# Patient Record
Sex: Female | Born: 1960 | Race: White | Hispanic: No | Marital: Single | Smoking: Never smoker
Health system: Southern US, Community
[De-identification: ages and names within clinical notes are randomized; demographics above are authoritative.]

## PROBLEM LIST (undated history)

## (undated) DIAGNOSIS — H9190 Unspecified hearing loss, unspecified ear: Secondary | ICD-10-CM

## (undated) DIAGNOSIS — F419 Anxiety disorder, unspecified: Secondary | ICD-10-CM

## (undated) DIAGNOSIS — Q249 Congenital malformation of heart, unspecified: Secondary | ICD-10-CM

## (undated) HISTORY — PX: CARDIAC SURGERY: SHX584

---

## 2013-07-24 ENCOUNTER — Emergency Department
Admission: EM | Admit: 2013-07-24 | Disposition: A | Discharge status: Home / Self Care | Payer: Self-pay | Source: Ambulatory Visit | Attending: Emergency Medicine | Admitting: Emergency Medicine

## 2013-07-24 LAB — HM HIV SCREENING OFFERED

## 2013-07-24 NOTE — First Provider Contact (Signed)
ED Medical Screening Exam Note    Initial provider evaluation performed by   ED First Provider Contact    Date/Time Event User Comments    07/24/13 2331 ED Provider First Contact Husam Hohn A Initial Face to Face Provider Contact            Larey SeatFell tonight and c/o mainly right knee pain   Some low back pain and right arm pain also but seems minimal     Orders placed:  XRAYS     Patient requires further evaluation.     Tramaine Snell, PA, 07/24/2013, 11:31 PM

## 2013-07-24 NOTE — ED Notes (Addendum)
Right leg injury after falling in parking lot.  PT right leg is swollen.  ASL interpreter paged.

## 2013-07-25 ENCOUNTER — Encounter: Payer: Self-pay | Admitting: Emergency Medicine

## 2013-07-25 MED ORDER — IBUPROFEN 400 MG PO TABS *I*
800.0000 mg | ORAL_TABLET | Freq: Once | ORAL | Status: DC
Start: 2013-07-25 — End: 2013-07-25

## 2013-07-25 NOTE — ED Notes (Signed)
Patient verbalizes understanding of discharge instructions and pain mgmt. Appropriate clothing in place, VSS, AOx4. Patient ambulated out on own with crutches, stable while ambulatory, using crutches appropriately

## 2013-07-25 NOTE — ED Provider Progress Notes (Signed)
ED Provider Progress Note    Trace knee effusion seen on xray. Knee immobilizer and crutches given. Patient ambulates without assistance. Will d/c to home with PCP f/u. Patient agrees with plan       Janett BillowGiancarlo Arvilla Salada, MD, 07/25/2013, 1:49 AM

## 2013-07-25 NOTE — ED Provider Notes (Addendum)
History     Chief Complaint   Patient presents with    Leg Injury       HPI Comments: Cassie Murray is a 53 y.o. female who presents with right knee pain after a mechanical fall on uneven pavement. Patient reports she landed on her knee and arms with no head trauma, LOC, or other injuries. Patient was able to ambulate at the scene. Patient denies numbness, weakness, lacerations. Pain is 8/10, aching, worse with ambulation.      History provided by:  Patient      History reviewed. No pertinent past medical history.         No past surgical history on file.    No family history on file.      Social History      has no tobacco, alcohol, drug, and sexual activity history on file.    Living Situation    Questions Responses    Patient lives with     Homeless     Caregiver for other family member     External Services     Employment     Domestic Violence Risk           Problem List     There is no problem list on file for this patient.      Review of Systems   Review of Systems   Constitutional: Negative for fever and chills.   HENT: Negative for trouble swallowing.    Eyes: Negative for redness.   Respiratory: Negative for shortness of breath.    Cardiovascular: Negative for chest pain.   Gastrointestinal: Negative for nausea, vomiting, abdominal pain and diarrhea.   Endocrine: Negative for polyuria.   Musculoskeletal: Positive for joint swelling and gait problem. Negative for back pain, neck pain and neck stiffness.   Skin: Negative for rash.   Allergic/Immunologic: Negative for immunocompromised state.   Neurological: Negative for syncope, weakness, numbness and headaches.   Hematological: Negative for adenopathy.   Psychiatric/Behavioral: Negative for confusion.       Physical Exam       ED Triage Vitals   BP Heart Rate Heart Rate(via Pulse Ox) Resp Temp Temp Source SpO2 O2 Device O2 Flow Rate   07/24/13 2328 07/24/13 2328 -- 07/24/13 2328 07/24/13 2328 07/25/13 0219 07/24/13 2328 07/25/13 0142 --   131/62 mmHg  69  18 36.3 C (97.3 F) TEMPORAL 98 % None (Room air)       Weight           --                          Physical Exam   Constitutional: No distress.   HENT:   Head: Normocephalic and atraumatic.   Right Ear: External ear normal.   Left Ear: External ear normal.   Nose: Nose normal.   Mouth/Throat: Oropharynx is clear and moist.   Eyes: Pupils are equal, round, and reactive to light.   Neck: Normal range of motion. Neck supple. No tracheal deviation present.   Cardiovascular: Normal rate, regular rhythm and normal heart sounds.    Pulmonary/Chest:   Effort normal.  No chest tenderness.  Breath sounds present b/l  No crepitus or gross deformity   Abdominal: Soft. She exhibits no distension. There is no tenderness.   Musculoskeletal:   Mild right knee effusion with superficial abrasion. Tenderness to patella and medial joint line. No crepitus, ecchymosis, instability.  Mild tenderness to normal appearing right upper arm.    No midline spinal tenderness, Sensation and ROM grossly intact    No gross deformities of back and buttocks, Sensation and ROM grossly intact    No gross deformities of shoulders, elbows, wrists, hands Sensation and ROM grossly intact. Pulses present b/l. Normal cap refill.     Pelvis stable, nontender, with no gross deformities. Sensation of pelvis and genitals grossly intact    No other gross deformities of hips, knees, ankles, feet, Sensation and ROM grossly intact, pulses present b/l. Normal Cap refill.     Neurological:   Patient is alert and answering questions appropriately with no gross deficits.    Skin: Skin is warm and dry.   Psychiatric: Her behavior is normal.   Nursing note and vitals reviewed.      Medical Decision Making        Initial Evaluation:  ED First Provider Contact    Date/Time Event User Comments    07/24/13 2331 ED Provider First Contact CHILELLI, TIMOTHY A Initial Face to Face Provider Contact          Patient seen by me today 07/25/2013 at 0110    Assessment:  53  y.o., female comes to the ED with right knee pain and effusion after a fall from standing. Patient is at risk for fracture, effusion and sprain and requires emergent imaging with likely discharge to home.    Differential Diagnosis includes   Traumatic effusion, fracture, sprain, strain, abrasion            Plan:    Motrin  Knee x ray  Knee immobilizer, crutches, ACE wrap  Anticipate discharge to home        Janett BillowGiancarlo Rondash, MD    Janett Billowondash, Giancarlo, MD  07/25/13 [phone]        Patient seen by me on 07/25/2013 at 1:35 AM.    History:   I reviewed this patient, reviewed the resident note and agree     Exam:    I examined this patient, reviewed the resident note and agree     Decision Making:   I discussed with the documented resident decision making and agree     ED  Diagnosis:   Fall;  right knee contusion & sprain.    Author Hayes LudwigJONATHAN Raygen Dahm, MD          Hayes LudwigMarkson, Mortimer Bair, MD  07/31/13 [phone]

## 2013-07-25 NOTE — Discharge Instructions (Signed)
You have been seen and evaluated at [facility] and found to have a small knee effusion. You were treated with motrin and a knee immobilizer. You will be discharged to home. Please follow up with your PCP. Please take tylenol and ibuprofen as directed by the manufacturer for pain. Please use rest, ice, and elevation for your pain. Please see attached pamphlet for more information.    Some illnesses are best understood/treated over time.  May return day or night for significant worsening.  A call to primary care physician is recommended for any acute concerns.   Close follow up is recommended.       SEEK IMMEDIATE MEDICAL CARE IF:   You develop a rash.   You have difficulty breathing.   You have any allergic reactions from medications you may have been given.   There is severe pain with any motion of the knee.

## 2013-07-25 NOTE — ED Notes (Signed)
Patient reporting pain in R knee after falling in parking lot. Small abrasion to R knee, patient reporting 8/10 pain but does not want any pain medication. Patient was able to tolerate standing to put on pants and shoes. Provider at bedside with crutches. Will continue to monitor and treat per orders.

## 2014-05-02 ENCOUNTER — Encounter (HOSPITAL_COMMUNITY): Payer: Self-pay

## 2014-05-02 ENCOUNTER — Emergency Department (HOSPITAL_COMMUNITY): Payer: Medicare Other

## 2014-05-02 ENCOUNTER — Emergency Department (HOSPITAL_COMMUNITY)
Admission: EM | Admit: 2014-05-02 | Discharge: 2014-05-02 | Disposition: A | Discharge status: Home / Self Care | Payer: Medicare Other | Attending: Emergency Medicine | Admitting: Emergency Medicine

## 2014-05-02 DIAGNOSIS — Z8659 Personal history of other mental and behavioral disorders: Secondary | ICD-10-CM | POA: Insufficient documentation

## 2014-05-02 DIAGNOSIS — Z8774 Personal history of (corrected) congenital malformations of heart and circulatory system: Secondary | ICD-10-CM | POA: Insufficient documentation

## 2014-05-02 DIAGNOSIS — H9190 Unspecified hearing loss, unspecified ear: Secondary | ICD-10-CM | POA: Insufficient documentation

## 2014-05-02 DIAGNOSIS — G5692 Unspecified mononeuropathy of left upper limb: Secondary | ICD-10-CM | POA: Insufficient documentation

## 2014-05-02 DIAGNOSIS — R200 Anesthesia of skin: Secondary | ICD-10-CM | POA: Insufficient documentation

## 2014-05-02 DIAGNOSIS — R079 Chest pain, unspecified: Secondary | ICD-10-CM | POA: Diagnosis present

## 2014-05-02 HISTORY — DX: Congenital malformation of heart, unspecified: Q24.9

## 2014-05-02 HISTORY — DX: Anxiety disorder, unspecified: F41.9

## 2014-05-02 HISTORY — DX: Unspecified hearing loss, unspecified ear: H91.90

## 2014-05-02 LAB — BASIC METABOLIC PANEL
ANION GAP: 7 (ref 5–15)
BUN: 8 mg/dL (ref 6–23)
CALCIUM: 9.6 mg/dL (ref 8.4–10.5)
CO2: 27 mmol/L (ref 19–32)
CREATININE: 0.65 mg/dL (ref 0.50–1.10)
Chloride: 105 mmol/L (ref 96–112)
GFR calc Af Amer: >90 mL/min (ref >90)
Glucose, Bld: 90 mg/dL (ref 70–99)
Potassium: 3.9 mmol/L (ref 3.5–5.1)
Sodium: 139 mmol/L (ref 135–145)

## 2014-05-02 LAB — I-STAT TROPONIN, ED
Troponin i, poc: 0 ng/mL (ref 0.00–0.08)
Troponin i, poc: 0 ng/mL (ref 0.00–0.08)

## 2014-05-02 LAB — CBC
HCT: 42.9 % (ref 36.0–46.0)
HEMOGLOBIN: 14.5 g/dL (ref 12.0–15.0)
MCH: 30 pg (ref 26.0–34.0)
MCHC: 33.8 g/dL (ref 30.0–36.0)
MCV: 88.8 fL (ref 78.0–100.0)
Platelets: 238 10*3/uL (ref 150–400)
RBC: 4.83 MIL/uL (ref 3.87–5.11)
RDW: 13.4 % (ref 11.5–15.5)
WBC: 6.2 10*3/uL (ref 4.0–10.5)

## 2014-05-02 LAB — BRAIN NATRIURETIC PEPTIDE: B NATRIURETIC PEPTIDE 5: 17.4 pg/mL (ref 0.0–100.0)

## 2014-05-02 MED ORDER — DIAZEPAM 5 MG PO TABS: 5.0000 mg | ORAL_TABLET | Freq: Once | ORAL | Status: DC

## 2014-05-02 MED ORDER — CYCLOBENZAPRINE HCL 10 MG PO TABS: 10.0000 mg | ORAL_TABLET | Freq: Two times a day (BID) | ORAL | Status: AC | PRN

## 2014-05-02 MED ORDER — HYDROCODONE-ACETAMINOPHEN 5-325 MG PO TABS: 1.0000 | ORAL_TABLET | Freq: Four times a day (QID) | ORAL | Status: AC | PRN

## 2014-05-02 MED ORDER — LORAZEPAM 2 MG/ML IJ SOLN
1.0000 mg | Freq: Once | INTRAMUSCULAR | Status: AC
  Administered 2014-05-02: 1 mg via INTRAVENOUS
  Filled 2014-05-02: qty 1

## 2014-05-02 NOTE — ED Notes (Signed)
Pt presents with 5 day h/o L arm numbness.  Pt reports numbness to L leg that began yesterday; reports onset of palpitations and chest pain while walking today, was relieved with rest.  Pt reports shortness of breath.

## 2014-05-02 NOTE — ED Provider Notes (Signed)
CSN: [id number]     Arrival date & time 05/02/14  1450 History   First MD Initiated Contact with Patient 05/02/14 1635     No chief complaint on file.    (Consider location/radiation/quality/duration/timing/severity/associated sxs/prior Treatment) Patient is a 54 y.o. female presenting with neurologic complaint. The history is provided by the patient.  Neurologic Problem This is a new problem. The current episode started 2 days ago. The problem occurs constantly. The problem has not changed since onset.Associated symptoms include chest pain (left lateral, feels like muscle tightness). Pertinent negatives include no abdominal pain and no shortness of breath. Nothing aggravates the symptoms. Nothing relieves the symptoms.    Past Medical History  Diagnosis Date  . Congenital heart disease   . Anxiety   . Hearing impaired    Past Surgical History  Procedure Laterality Date  . Cardiac surgery     History reviewed. No pertinent family history. History  Substance Use Topics  . Smoking status: Never Smoker   . Smokeless tobacco: Not on file  . Alcohol Use: No   OB History    No data available     Review of Systems  Constitutional: Negative for fever.  Respiratory: Negative for cough and shortness of breath.   Cardiovascular: Positive for chest pain (left lateral, feels like muscle tightness). Negative for leg swelling.  Gastrointestinal: Negative for vomiting and abdominal pain.  All other systems reviewed and are negative.     Allergies  Bee venom  Home Medications   Prior to Admission medications   Not on File   BP 120/88 mmHg  Pulse 74  Temp(Src) 99 F (37.2 C) (Oral)  Resp 19  Ht 5\' 2"  (1.575 m)  Wt 145 lb (65.772 kg)  BMI 26.51 kg/m2  SpO2 100% Physical Exam  Constitutional: She is oriented to person, place, and time. She appears well-developed and well-nourished. No distress.  HENT:  Head: Normocephalic and atraumatic.  Mouth/Throat: Oropharynx is clear  and moist.  Eyes: EOM are normal. Pupils are equal, round, and reactive to light.  Neck: Normal range of motion. Neck supple.  Cardiovascular: Normal rate and regular rhythm.  Exam reveals no friction rub.   No murmur heard. Pulmonary/Chest: Effort normal and breath sounds normal. No respiratory distress. She has no wheezes. She has no rales.  Abdominal: Soft. She exhibits no distension. There is no tenderness. There is no rebound.  Musculoskeletal: Normal range of motion. She exhibits no edema.  Neurological: She is alert and oriented to person, place, and time. A sensory deficit (altered light touch and sedation the left upper arm and left forearm anterior and posteriorly.) is present. No cranial nerve deficit. She exhibits normal muscle tone. Coordination normal. GCS eye subscore is 4. GCS verbal subscore is 5. GCS motor subscore is 6.  Skin: She is not diaphoretic.  Nursing note and vitals reviewed.   ED Course  Procedures (including critical care time) Labs Review Labs Reviewed  CBC  BASIC METABOLIC PANEL  BRAIN NATRIURETIC PEPTIDE  I-STAT TROPOININ, ED    Imaging Review Dg Chest 2 View  05/02/2014   CLINICAL DATA:  Chest pain, left arm numbness  EXAM: CHEST  2 VIEW  COMPARISON:  None.  FINDINGS: Cardiomediastinal silhouette is unremarkable. No acute infiltrate or pleural effusion. No pulmonary edema. Mild linear atelectasis or scarring in lingula. Minimal degenerative changes thoracic spine.  IMPRESSION: No acute infiltrate or pulmonary edema. Subtle linear atelectasis or scarring in lingula.   Electronically Signed   By:  Natasha Mead M.D.   On: 05/02/2014 16:34   Mr Brain Wo Contrast  05/02/2014   CLINICAL DATA:  54 year old female with left upper extremity numbness for 5 days. Left lower extremity numbness began yesterday. Palpitations. Initial encounter.  EXAM: MRI HEAD WITHOUT CONTRAST  TECHNIQUE: Multiplanar, multiecho pulse sequences of the brain and surrounding structures were  obtained without intravenous contrast.  COMPARISON:  None.  FINDINGS: Cerebral volume is normal. No restricted diffusion to suggest acute infarction. No midline shift, mass effect, evidence of mass lesion, ventriculomegaly, extra-axial collection or acute intracranial hemorrhage. Cervicomedullary junction and pituitary are within normal limits. Negative visualized cervical spine. Major intracranial vascular flow voids are preserved. Wallace Cullens and white matter signal is within normal limits for age throughout the brain.  Visible internal auditory structures appear normal. Negative paranasal sinuses and mastoids except for left maxillary alveolar recess mucosal thickening. Visualized orbit soft tissues are within normal limits. Visualized scalp soft tissues are within normal limits. Bone marrow signal within normal limits.  IMPRESSION: Normal for age Normal MRI appearance of the brain.   Electronically Signed   By: Odessa Fleming M.D.   On: 05/02/2014 19:36     EKG Interpretation   Date/Time:  Thursday May 02 2014 15:06:15 EST Ventricular Rate:  82 PR Interval:  152 QRS Duration: 88 QT Interval:  364 QTC Calculation: 425 R Axis:   -32 Text Interpretation:  Normal sinus rhythm Left axis deviation Abnormal ECG  No prior for comparison Confirmed by Gwendolyn Grant  MD, Lakedra Washington ([id number]) on 05/02/2014  4:37:40 PM      MDM   Final diagnoses:  Left arm numbness    54 year old female presents with left arm numbness. Constant since it began. She reported me to days by reported to triage 5 days. Present for the past several days. She also reports some left-sided chest pain that is in her lateral chest. It's described as a muscle squeezing. No heaviness or central pressure in her chest. No shortness of breath or fever. She denied any shortness of breath with me despite saying she was having shortness of breath in triage. Here vitals are stable. She has some muscle tenderness in her lateral pectoralis and anterior deltoid  and in her trapezius. She has no muscle spasm. Left arm had numbness on the anterior posterior side of the upper arm and forearm. Normal hand sensation.  EKG is okay. This does not seem consistent with ACS. She's not complaining of any persistent chest pain. With reproducible muscle tenderness, concern for musculoskeletal origin. Will do MRI of her head to look for possible stroke. Normal chest x-ray here. MR ok. Serial troponins. Stable for discharge.    Elwin Mocha, MD 05/02/14 [phone]

## 2014-05-02 NOTE — ED Notes (Signed)
Pt is deaf 

## 2014-05-02 NOTE — Discharge Instructions (Signed)
°Emergency Department Resource Guide °1) Find a Doctor and Pay Out of Pocket °Although you won't have to find out who is covered by your insurance plan, it is a good idea to ask around and get recommendations. You will then need to call the office and see if the doctor you have chosen will accept you as a new patient and what types of options they offer for patients who are self-pay. Some doctors offer discounts or will set up payment plans for their patients who do not have insurance, but you will need to ask so you aren't surprised when you get to your appointment. ° °2) Contact Your Local Health Department °Not all health departments have doctors that can see patients for sick visits, but many do, so it is worth a call to see if yours does. If you don't know where your local health department is, you can check in your phone book. The CDC also has a tool to help you locate your state's health department, and many state websites also have listings of all of their local health departments. ° °3) Find a Walk-in Clinic °If your illness is not likely to be very severe or complicated, you may want to try a walk in clinic. These are popping up all over the country in pharmacies, drugstores, and shopping centers. They're usually staffed by nurse practitioners or physician assistants that have been trained to treat common illnesses and complaints. They're usually fairly quick and inexpensive. However, if you have serious medical issues or chronic medical problems, these are probably not your best option. ° °No Primary Care Doctor: °- Call [facility] at  832-8000 - they can help you locate a primary care doctor that  accepts your insurance, provides certain services, etc. °- Physician Referral Service- 1-800-533-3463 ° °Chronic Pain Problems: °Organization         Address  Phone   Notes  °[facility]  (336) 297-2271 Patients need to be referred by their primary care doctor.  ° °Medication  Assistance: °Organization         Address  Phone   Notes  °[place] [facility] [address] °[place], [place] [address] (336) 641-8030 --Must be a resident of [place] °-- Must have NO insurance coverage whatsoever (no Medicaid/ Medicare, etc.) °-- The pt. MUST have a primary care doctor that directs their care regularly and follows them in the community °  °[facility]  (866) 331-1348   °[employer]  (888) 892-1162   ° °Agencies that provide inexpensive medical care: °Organization         Address  Phone   Notes  °[facility]  (336) 832-8035   °[facility]    (336) 832-7272   °[facility] [address] °[place],  [address] (336) 832-4777   °[facility] [address], °[place] (336) 271-4999   °[facility]    (336) 373-0678   °[facility]    (336) 272-1050   °[facility] ° [address], [place] Phone:  (336) 832-4444, Fax:  (336) 832-4440 Hours of Operation:  9 am - 6 pm, M-F.  Also accepts Medicaid/Medicare and self-pay.  °[facility] ° [address], [place] Phone: (336) 832-3150, Fax: (336) [phone]. Hours of Operation:  8:30 am - 5:30 pm, M-F.  Also accepts Medicaid and self-pay.  °[facility] [place] [address], [place] Phone: (336) 878-6027   °[facility] [address], [place], [place] (336)723-1848, Ext. 123 Mondays & Thursdays: 7-9 AM.  First 15 patients are seen on a first come, first serve basis. °  ° °Medicaid-accepting [place] Providers: ° °Organization         Address  Phone   Notes  °[facility] [address], [place] (336) 641-2100 Also accepts self-pay patients.  °[facility] [address], [place] ° (336) 856-9996   °[facility] [address], [place]  (336) 288-8857   °[facility] [address] [place] [address], [place] (336) 299-7000   °Veita Bland [address], [place]  ° (336) 373-1557 Only accepts [place] Access Medicaid patients after they have their name applied to their card.  ° °Self-Pay (no insurance) in [place]: ° °Organization         Address  Phone   Notes  °Sickle Cell Patients, [facility] [address], [place] (336) 832-1970   °[facility] [address], [place] (336) 832-4400   °[facility] ° [address], [place] (336) 992-4800   °[facility]/Dr. Osei-Bonsu ° [address] [place] [address], [place] or [address], [place] (336) [phone] Phone number for both [place] and [place] locations is the same.  °[facility] [address], [place] (336) 299-0000   °[facility] [address] [place] [address], [place] or [address] (336) 852-7530 °(336) 878-2260   °[facility] [address], [place] (336) 350-1642, phone; (336) 294-5005, fax Sees patients 1st and 3rd Saturday of every month.  Must not qualify for public or private insurance (i.e. Medicaid, Medicare, [place] Health Choice, Veterans' Benefits) • Household income should be no more than 200% of the poverty level •The clinic cannot treat you if you are pregnant or think you are pregnant • Sexually transmitted diseases are not treated at the clinic.  ° ° °Dental Care: °Organization         Address  Phone  Notes  °[place] [facility] [address],  (336) 641-6152 Accepts children up to age 21 who are enrolled in Medicaid or [place] Health Choice; pregnant women with a Medicaid card; and children who have applied for Medicaid or [place] Health Choice, but were declined, whose parents can pay a reduced fee at time of service.  °[place]  [facility] [place]  [address], [place] (336) 641-7733 Accepts children up to age 21 who are enrolled in Medicaid or [place] Health Choice; pregnant women with a Medicaid card; and children who have applied for Medicaid or  Health Choice, but were declined, whose parents can pay a reduced fee at time of service.  °[facility] ° [address],  (336) 641-4533 Patients are seen by appointment only. Walk-ins are not accepted. [facility] will see patients 18 years of age and older. °Monday - Tuesday (8am-5pm) °Most Wednesdays (8:30-5pm) °$30 per visit, cash only  °[facility] ° [address], [place] (336) 641-4533 Patients are seen by appointment only. Walk-ins are not accepted. [facility] will see patients 18 years of age and older. °One   Wednesday Evening (Monthly: Volunteer Based).  $30 per visit, cash only  °[facility]  (919) 537-3737 for adults; Children under age 4, call Graduate Pediatric Dentistry at ([phone]) [phone]. Children aged 4-14, please call (919) [phone] to request a pediatric application. ° Dental services are provided in all areas of dental care including fillings, crowns and bridges, complete and partial dentures, implants, gum treatment, root canals, and extractions. Preventive care is also provided. Treatment is provided to both adults and children. °Patients are selected via a lottery and there is often a waiting list. °  °[facility] [address], °[place] ° (336) 763-8833 [email or web address] °  °[facility] [address], [place], [place] (336)723-1848, Ext. 123 Second and Fourth Thursday of each month, opens at 6:30 AM; Clinic ends at 9 AM.  Patients are seen on a first-come first-served basis, and a limited number are seen during each clinic.  ° °[facility] ° [address], [place], [place] (336) 723-7904    Eligibility Requirements °You must have lived in [place], [place], or [place] counties for at least the last three months. °  You cannot be eligible for state or federal sponsored healthcare insurance, including Veterans Administration, Medicaid, or Medicare. °  You generally cannot be eligible for healthcare insurance through your employer.  °  How to apply: °Eligibility screenings are held every Tuesday and Wednesday afternoon from 1:00 pm until 4:00 pm. You do not need an appointment for the interview!  °[facility] [address], [place], [place] 336-631-2330   °[facility]  336-342-8273   °[facility]  336-703-3100   °[facility]  336-570-6415   ° °Behavioral Health Resources in the Community: °Intensive Outpatient Programs °Organization         Address  Phone  Notes  °[facility] [address], [place], [place] 336-878-6098   °[facility] [address], [place], [place] 336-832-9800   °[facility] [address], [place], [place] ° 336-882-2125   °[facility] [address],  °[place], [place] 1-800-853-5163 or 336-641-4981   °Substance Abuse Resources °Organization         Address  Phone  Notes  °[facility]  336-882-2125   °[facility]  336-784-9470   °[facility]  336-285-9073   °[facility]  336-845-3988   °Residential & Outpatient Substance Abuse Program  1-800-659-3381   °Psychological Services °Organization         Address  Phone  Notes  °[facility]  336- 832-9600   °[facility]  336- 378-7881   °[facility] [address], [place] 1-800-853-5163 or 336-641-4981   ° °Mobile Crisis Teams °Organization         Address  Phone  Notes  °[facility], Mobile Crisis Care Unit  1-877-626-1772   °Assertive °[facility] ° [address]  [place], [place] 336-834-9664   °Sharon DeEsch [address] °[place] 336-554-5454   ° °Self-Help/Support Groups °Organization         Address  Phone             Notes  °[facility]  - variety of support groups  336- 373-1402 Call for more information  °Narcotics Anonymous (NA), [facility] [address], °[place]   2 meetings at this location  ° °  Residential Treatment Programs °Organization         Address  Phone  Notes  °[facility] [address],    °[place]  1-866-801-8205   °[facility] ° [address], [place], [place] 704-293-8524   °[facility] [address], [place] 336-845-3988 Admissions: 8am-3pm M-F  °[facility] [address]    °[place], [place] 336-841-1104   °[facility] [address], [place], [place] 336-379-7146   °[facility] [address]  °[place], [place] 336-285-9073   °[facility] Intensive Outpatient [address], [place], [place] 336-852-3033   °[facility] [address]  °[place], [place] 1-877-615-2722 or 336-784-9470   °[facility] [address] [place], [place] 336-227-7417 Accepts Medicaid  °[facility] [address]  °[place] 1-800-659-3381 Substance Abuse/Addiction Treatment  ° °[place] Behavioral Health Resources °Organization         Address  Phone  Notes  °[facility]  (888) 581-9988   °Julie Brannon, PhD [address] [place], [place]   (336) 349-5553 or (336) 951-0000   °[facility]   [address] °[place], [place] (336) 349-4454   °[facility] [address], [place], [place] (336) 342-8316 Insurance/Medicaid/sponsorship through [facility]  °[facility] [address]                                    [place], [place] (336) 342-8316 Therapy/tele-psych/case    °[facility] [address]  ° [place], [place] (336) 349-2233    °Dr. Arfeen  (336) 349-4544   °[facility] [place]  [employer] [place] [facility] 1) [address],  °2) [address], [place] °3)  [address], [place] (336) 349-3220 °(336) 342-7768 ° °(336) 342-8140   °[place] [facility] (336) 342-1394 or (336) 342-3537 (After Hours)    ° ° °

## 2014-05-02 NOTE — ED Notes (Signed)
RN to MRI, pt cannot complete MRI d/t clostrapobia, pt states, "I just want someone to hold my hand." pt refuses Ativan

## 2014-05-02 NOTE — ED Notes (Signed)
MRI called, pt cannot complete MRI, this RN at bedside, pt agrees to medicine, IV started & 1 mg Ativan administered in MRI

## 2015-08-24 IMAGING — DX DG CHEST 2V
2 series · 2 of 2 positions shown · non-contrast
Comparison: None.

CLINICAL DATA: Chest pain, left arm numbness

EXAM:
CHEST  2 VIEW

[chest pa]
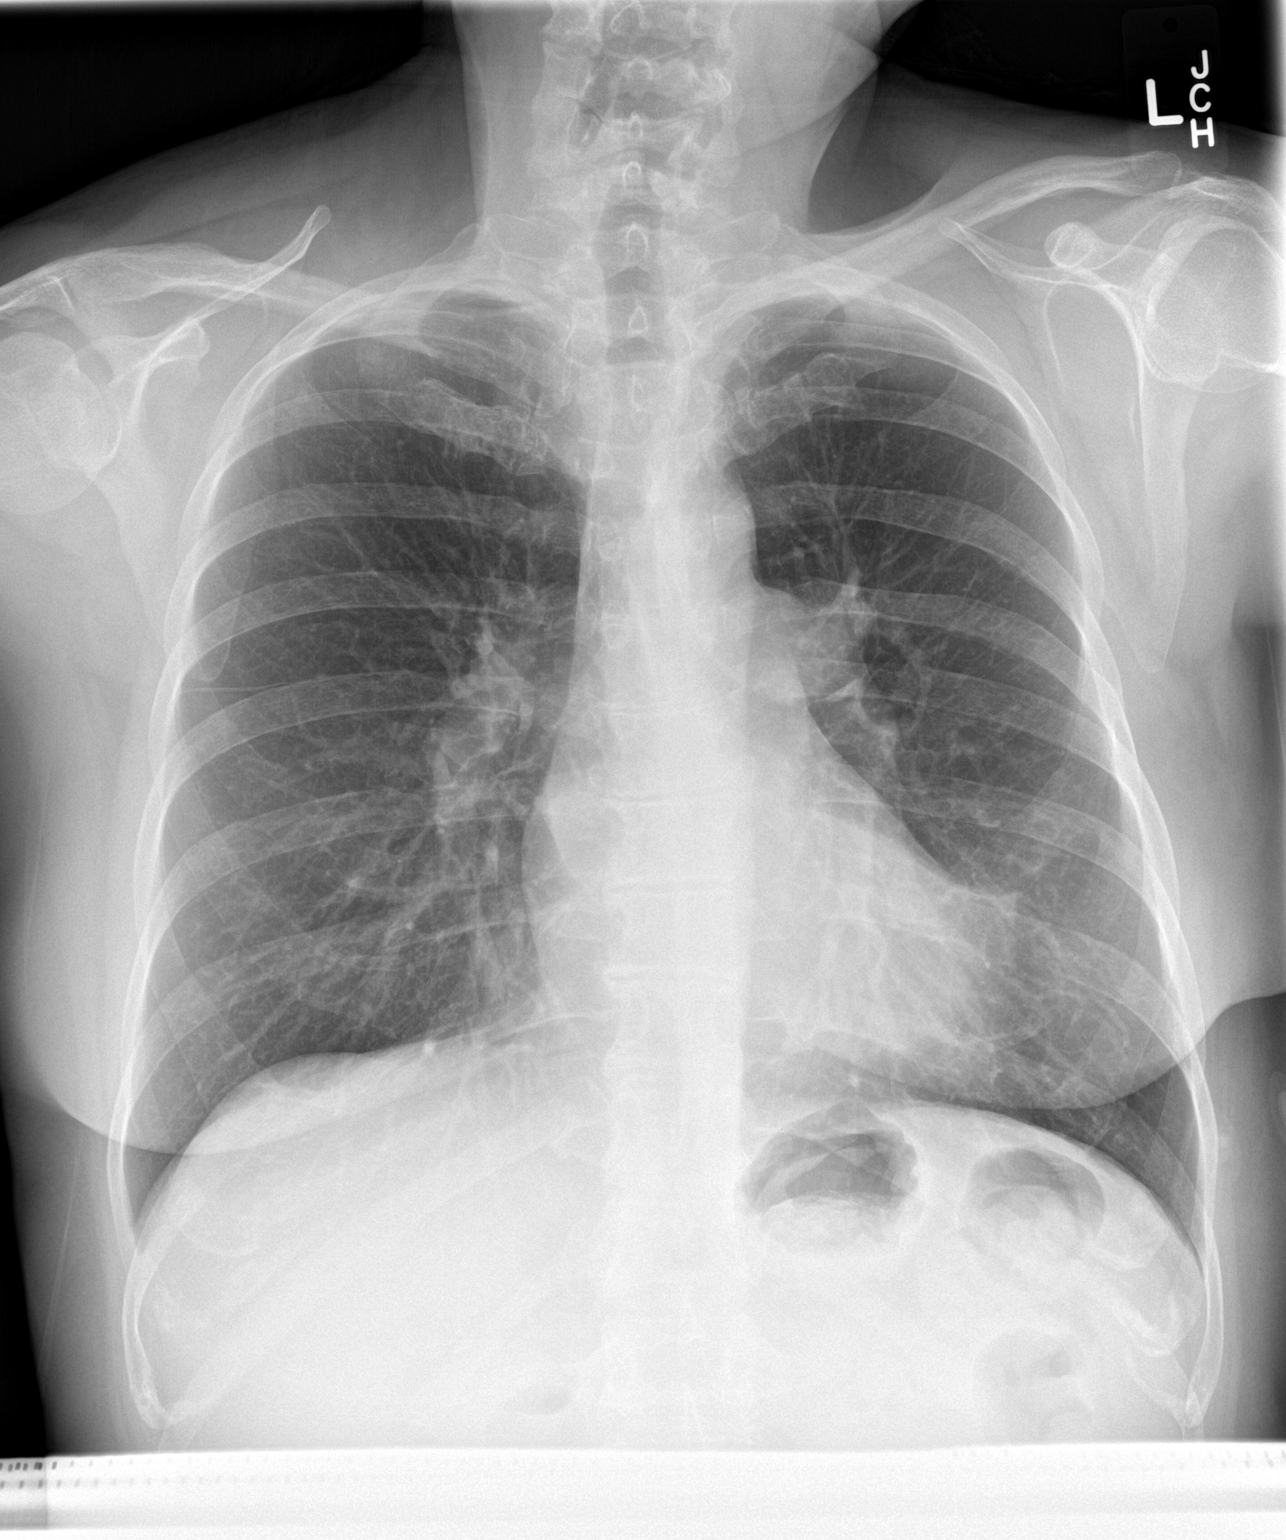

[chest lat]
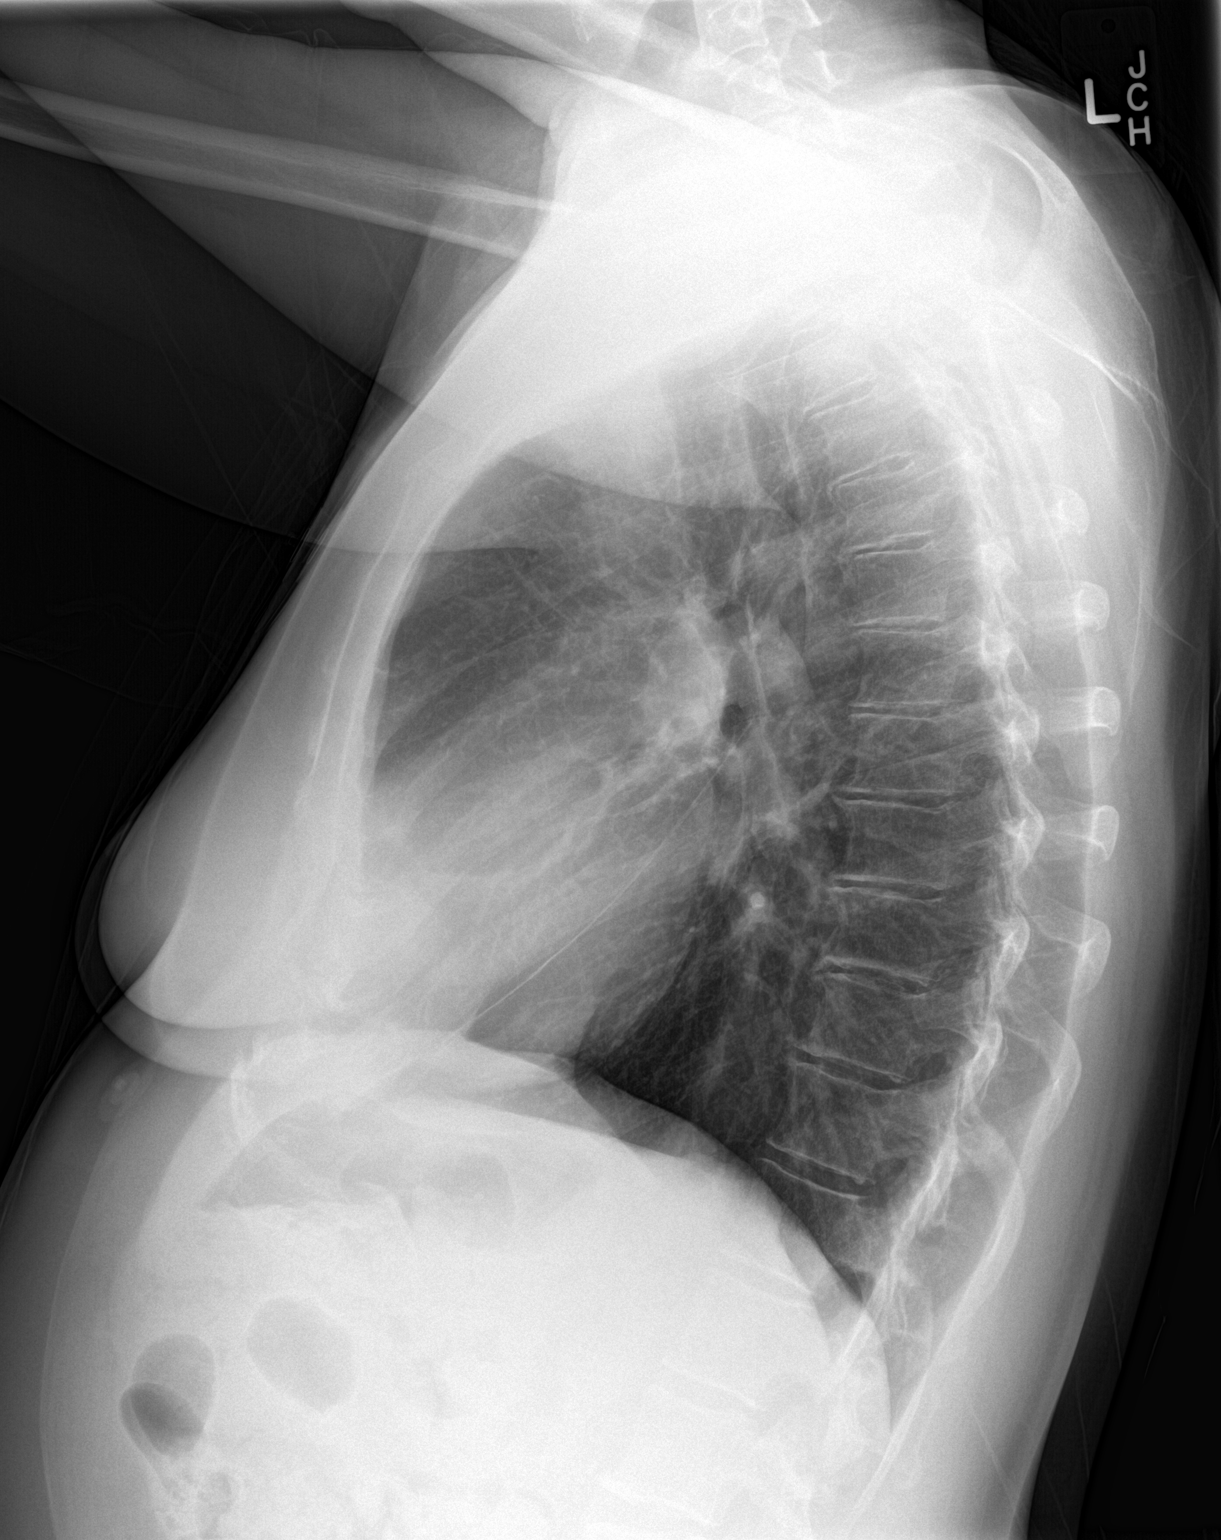

[2 of 2 positions shown; findings below may reference images not displayed]

FINDINGS: Cardiomediastinal silhouette is unremarkable. No acute infiltrate or
pleural effusion. No pulmonary edema. Mild linear atelectasis or
scarring in lingula. Minimal degenerative changes thoracic spine.
IMPRESSION: No acute infiltrate or pulmonary edema. Subtle linear atelectasis or
scarring in lingula.

## 8386-11-07 DEATH — deceased
